# Patient Record
Sex: Female | Born: 1945 | Race: White | Hispanic: No | Marital: Married | Smoking: Never smoker
Health system: Southern US, Community
[De-identification: ages and names within clinical notes are randomized; demographics above are authoritative.]

## PROBLEM LIST (undated history)

## (undated) DIAGNOSIS — I2510 Atherosclerotic heart disease of native coronary artery without angina pectoris: Secondary | ICD-10-CM

## (undated) DIAGNOSIS — C801 Malignant (primary) neoplasm, unspecified: Secondary | ICD-10-CM

## (undated) DIAGNOSIS — I10 Essential (primary) hypertension: Secondary | ICD-10-CM

## (undated) DIAGNOSIS — E785 Hyperlipidemia, unspecified: Secondary | ICD-10-CM

## (undated) HISTORY — DX: Hyperlipidemia, unspecified: E78.5

## (undated) HISTORY — DX: Atherosclerotic heart disease of native coronary artery without angina pectoris: I25.10

## (undated) HISTORY — DX: Malignant (primary) neoplasm, unspecified: C80.1

## (undated) HISTORY — PX: BREAST BIOPSY: SHX20

## (undated) HISTORY — DX: Essential (primary) hypertension: I10

---

## 1999-08-15 ENCOUNTER — Other Ambulatory Visit: Admission: RE | Admit: 1999-08-15 | Discharge: 1999-08-15 | Payer: Self-pay | Admitting: *Deleted

## 1999-08-15 ENCOUNTER — Encounter: Payer: Self-pay | Admitting: *Deleted

## 1999-08-15 ENCOUNTER — Encounter: Admission: RE | Admit: 1999-08-15 | Discharge: 1999-08-15 | Payer: Self-pay | Admitting: *Deleted

## 2002-12-22 ENCOUNTER — Encounter: Admission: RE | Admit: 2002-12-22 | Discharge: 2002-12-22 | Payer: Self-pay | Admitting: *Deleted

## 2004-02-10 ENCOUNTER — Encounter: Admission: RE | Admit: 2004-02-10 | Discharge: 2004-02-10 | Payer: Self-pay | Admitting: *Deleted

## 2005-05-02 ENCOUNTER — Encounter: Admission: RE | Admit: 2005-05-02 | Discharge: 2005-05-02 | Payer: Self-pay | Admitting: *Deleted

## 2006-05-23 ENCOUNTER — Encounter: Admission: RE | Admit: 2006-05-23 | Discharge: 2006-05-23 | Payer: Self-pay | Admitting: *Deleted

## 2007-05-27 ENCOUNTER — Encounter: Admission: RE | Admit: 2007-05-27 | Discharge: 2007-05-27 | Payer: Self-pay | Admitting: *Deleted

## 2008-02-23 ENCOUNTER — Encounter: Admission: RE | Admit: 2008-02-23 | Discharge: 2008-02-23 | Payer: Self-pay | Admitting: Otolaryngology

## 2008-03-01 ENCOUNTER — Other Ambulatory Visit: Admission: RE | Admit: 2008-03-01 | Discharge: 2008-03-01 | Payer: Self-pay | Admitting: Otolaryngology

## 2008-05-27 ENCOUNTER — Encounter: Admission: RE | Admit: 2008-05-27 | Discharge: 2008-05-27 | Payer: Self-pay | Admitting: *Deleted

## 2008-11-09 ENCOUNTER — Encounter: Admission: RE | Admit: 2008-11-09 | Discharge: 2008-11-09 | Payer: Self-pay | Admitting: Otolaryngology

## 2009-05-23 HISTORY — PX: CARDIOVASCULAR STRESS TEST: SHX262

## 2009-05-30 ENCOUNTER — Encounter: Admission: RE | Admit: 2009-05-30 | Discharge: 2009-05-30 | Payer: Self-pay | Admitting: *Deleted

## 2010-04-21 ENCOUNTER — Other Ambulatory Visit: Payer: Self-pay | Admitting: *Deleted

## 2010-04-21 DIAGNOSIS — Z1231 Encounter for screening mammogram for malignant neoplasm of breast: Secondary | ICD-10-CM

## 2010-06-01 ENCOUNTER — Ambulatory Visit
Admission: RE | Admit: 2010-06-01 | Discharge: 2010-06-01 | Disposition: A | Discharge status: Home / Self Care | Payer: Medicare Other | Source: Ambulatory Visit | Attending: *Deleted | Admitting: *Deleted

## 2010-06-01 DIAGNOSIS — Z1231 Encounter for screening mammogram for malignant neoplasm of breast: Secondary | ICD-10-CM

## 2010-09-27 IMAGING — US US SOFT TISSUE HEAD/NECK
1 series · 14 of 25 positions shown · non-contrast
Comparison: None

CLINICAL DATA: Right thyroid nodule.

THYROID ULTRASOUND
TECHNIQUE: Ultrasound examination of the thyroid gland and
adjacent soft tissues was performed.

[Series 1: us soft tissue head/neck · 0.07mm/px · 14 of 47 slices shown]
[im 1/47]
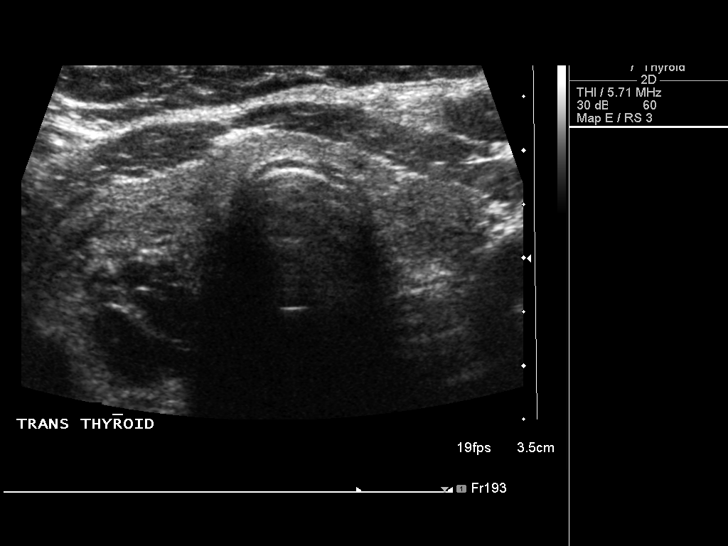
[im 4/47]
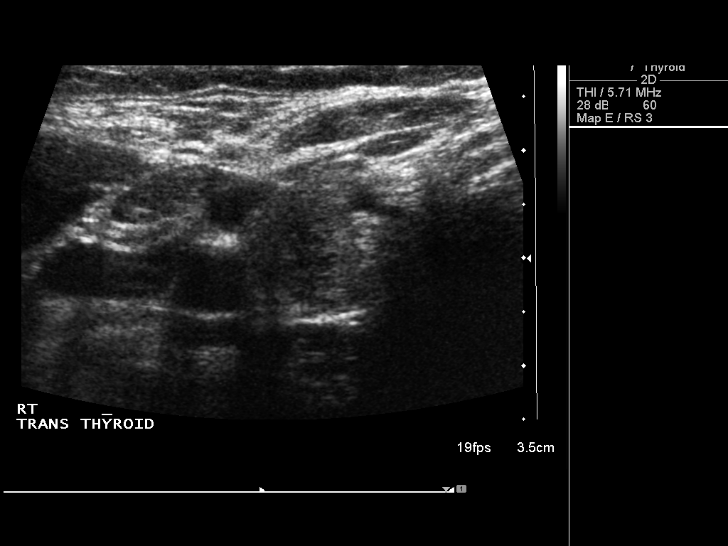
[im 8/47]
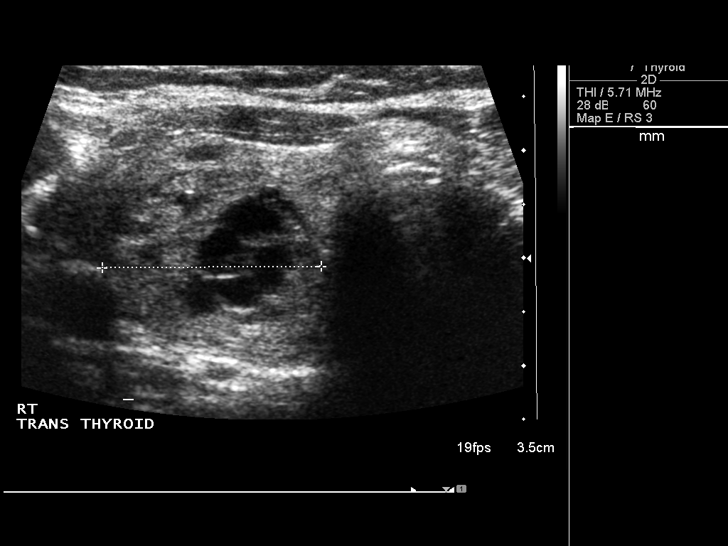
[im 12/47]
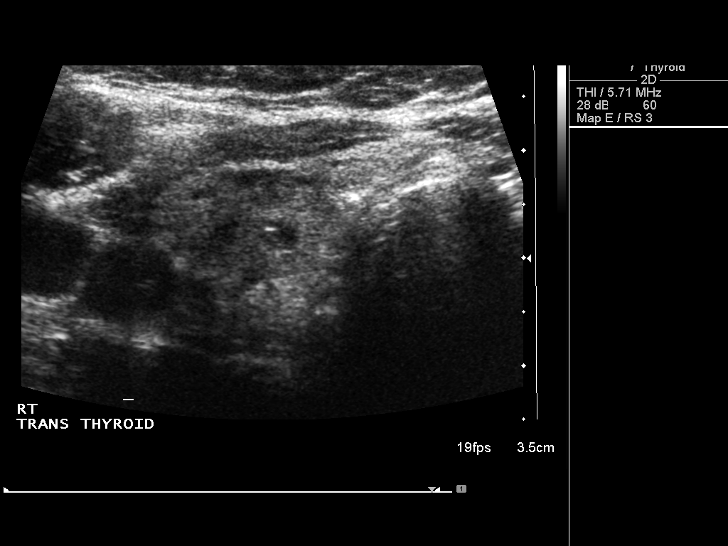
[im 16/47]
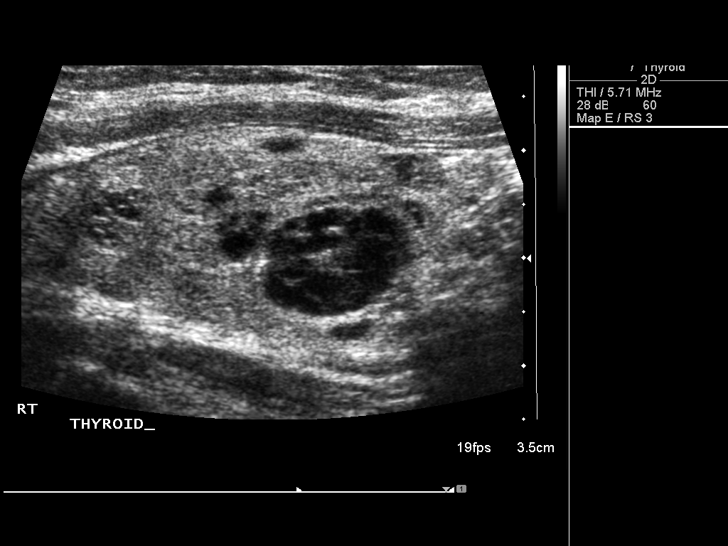
[im 18/47]
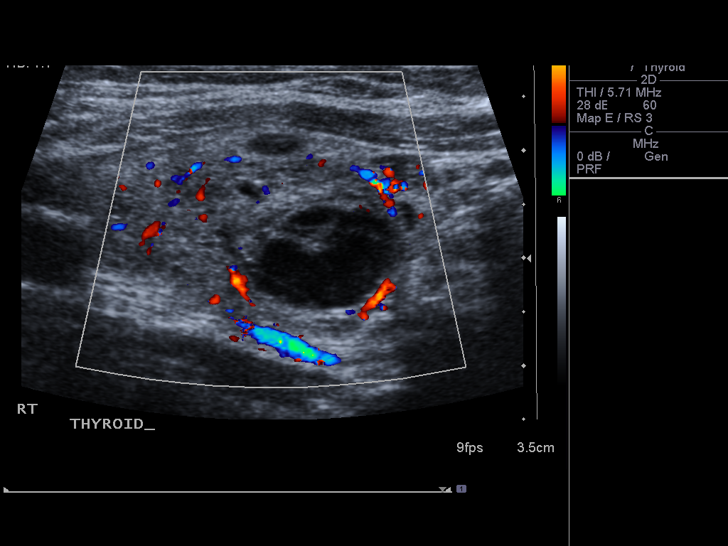
[im 22/47]
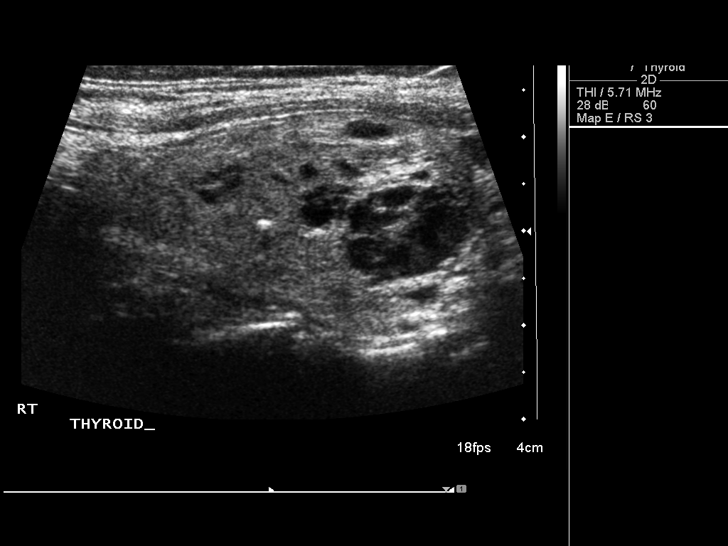
[im 25/47]
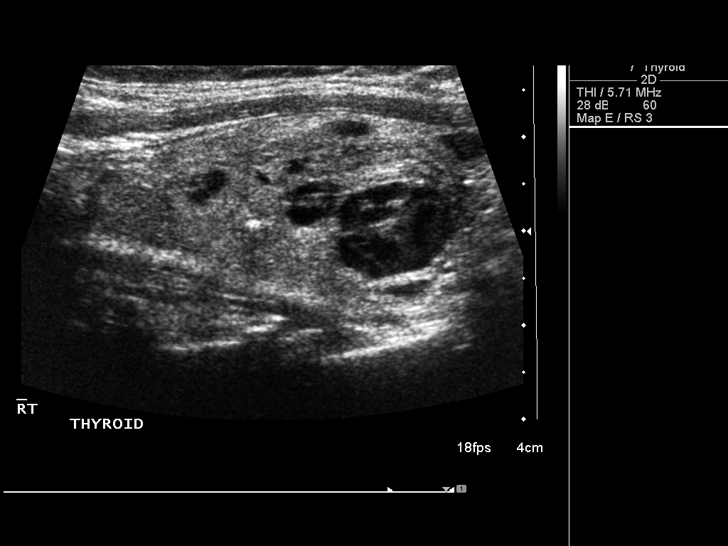
[im 29/47]
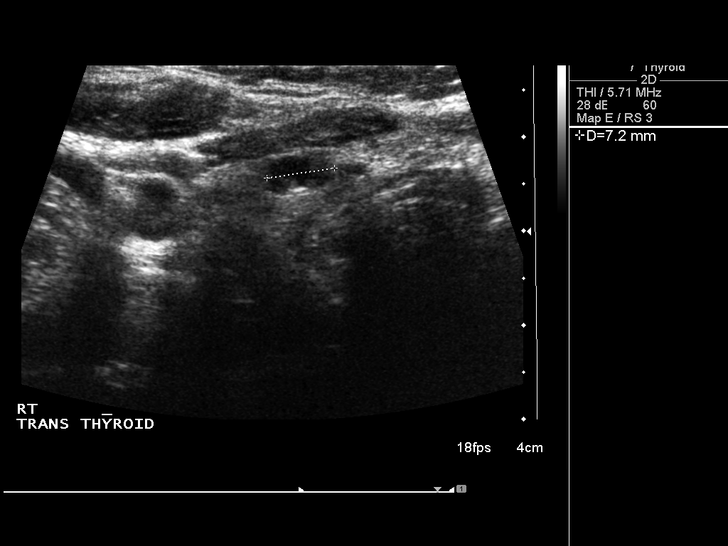
[im 31/47]
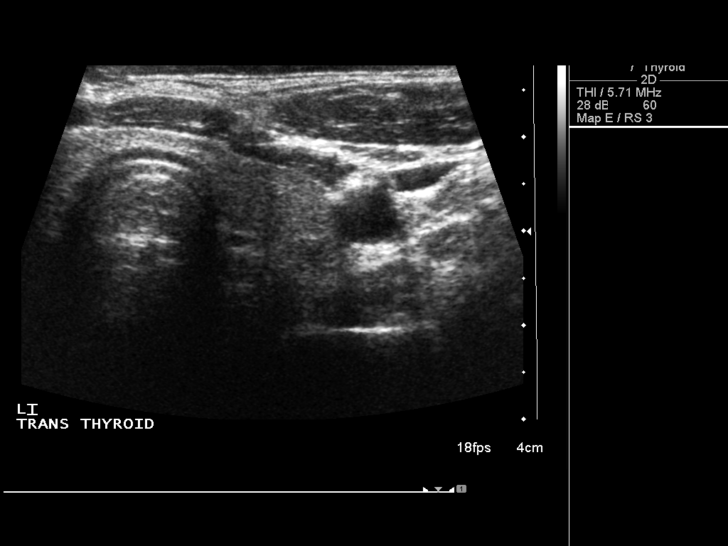
[im 35/47]
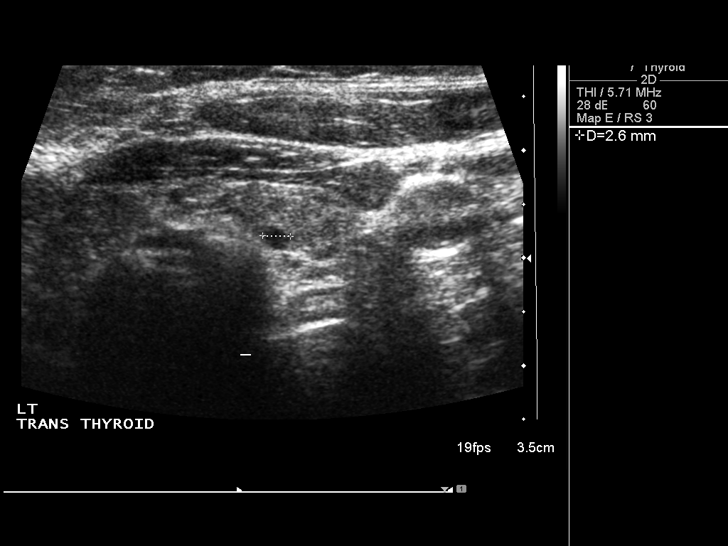
[im 39/47]
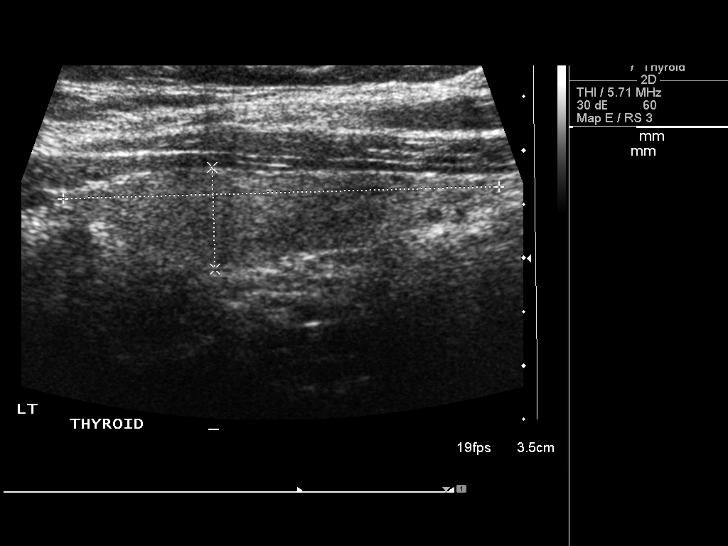
[im 43/47]
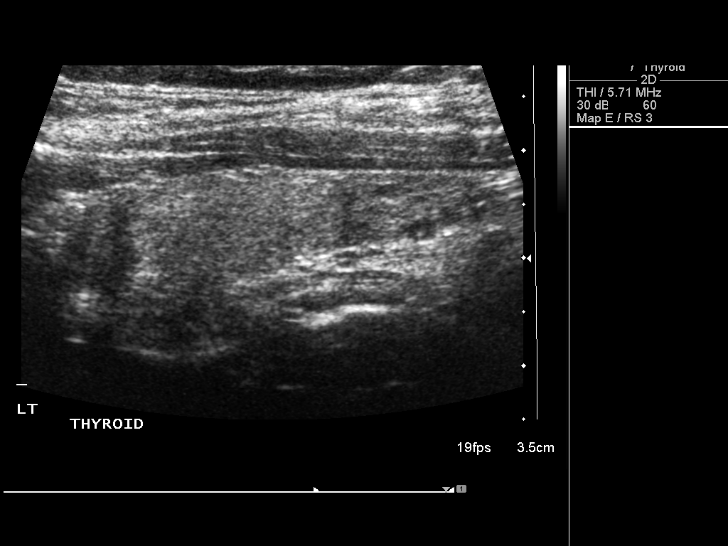
[im 47/47]
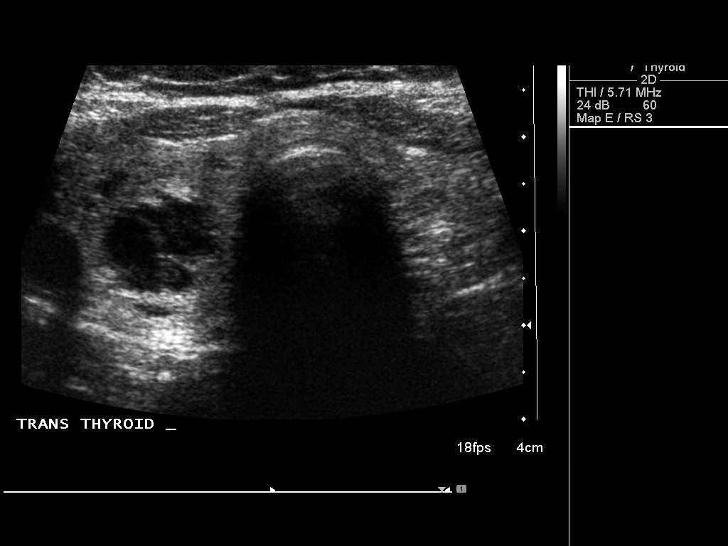

[14 of 25 positions shown; findings below may reference images not displayed]

FINDINGS: Right lobe of the thyroid measures 4.0 x 2.3 x 2.0 cm and
left lobe, 4.1 x 0.9 x 1.3 cm.  Isthmus measures 2 mm.  Thyroid
echotexture is heterogeneous.  There are sub centimeter hypoechoic
nodules bilaterally.  A dominant cystic and solid nodule spans the
mid and lower poles of the right thyroid, is somewhat ill-defined,
and measures approximately 2.5 x 1.6 x 2.0 cm.
IMPRESSION: Dominant cystic and solid right thyroid nodule.  Percutaneous
biopsy should be considered, as neoplasm cannot be excluded.

## 2011-04-24 ENCOUNTER — Other Ambulatory Visit: Payer: Self-pay | Admitting: *Deleted

## 2011-04-24 DIAGNOSIS — Z1231 Encounter for screening mammogram for malignant neoplasm of breast: Secondary | ICD-10-CM

## 2011-06-05 ENCOUNTER — Ambulatory Visit
Admission: RE | Admit: 2011-06-05 | Discharge: 2011-06-05 | Disposition: A | Discharge status: Home / Self Care | Payer: Medicare Other | Source: Ambulatory Visit | Attending: *Deleted | Admitting: *Deleted

## 2011-06-05 DIAGNOSIS — Z1231 Encounter for screening mammogram for malignant neoplasm of breast: Secondary | ICD-10-CM

## 2011-06-06 ENCOUNTER — Other Ambulatory Visit: Payer: Self-pay | Admitting: *Deleted

## 2011-06-06 DIAGNOSIS — R928 Other abnormal and inconclusive findings on diagnostic imaging of breast: Secondary | ICD-10-CM

## 2011-06-08 ENCOUNTER — Ambulatory Visit
Admission: RE | Admit: 2011-06-08 | Discharge: 2011-06-08 | Disposition: A | Discharge status: Home / Self Care | Payer: Medicare Other | Source: Ambulatory Visit | Attending: *Deleted | Admitting: *Deleted

## 2011-06-08 DIAGNOSIS — R928 Other abnormal and inconclusive findings on diagnostic imaging of breast: Secondary | ICD-10-CM

## 2011-06-14 IMAGING — US US SOFT TISSUE HEAD/NECK
1 series · 14 of 23 positions shown · non-contrast
Comparison: 02/23/2008

CLINICAL DATA: Right thyroid nodule

THYROID ULTRASOUND
TECHNIQUE: Ultrasound examination of the thyroid gland and
adjacent soft tissues was performed.

[Series 1: us soft tissue head/neck · 0.07mm/px · 14 of 23 slices shown]
[im 1/23]
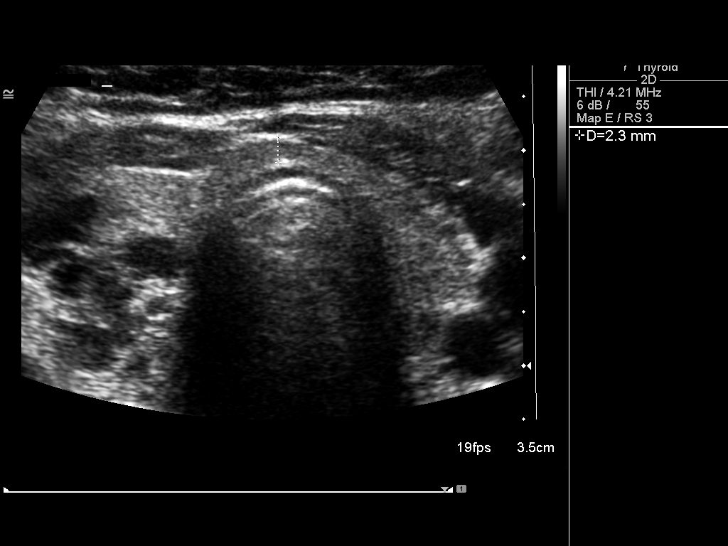
[im 3/23]
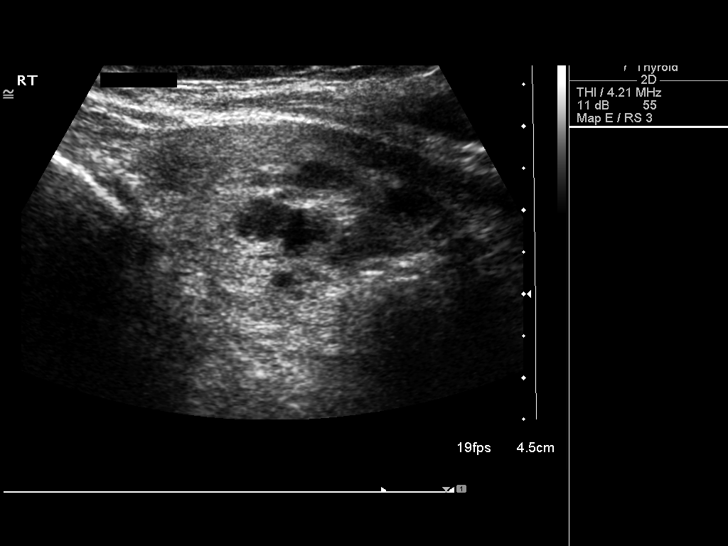
[im 5/23]
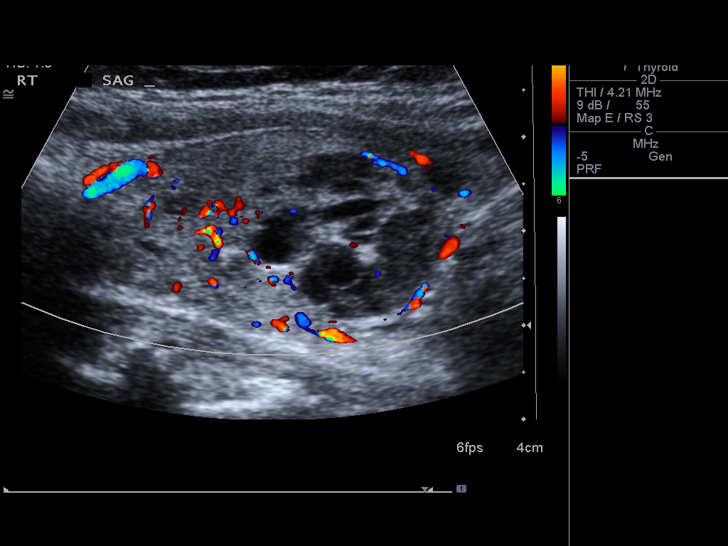
[im 6/23]
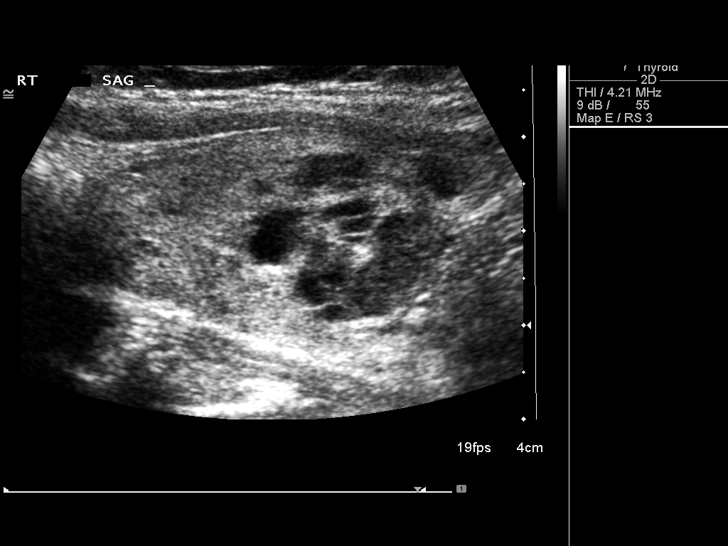
[im 8/23]
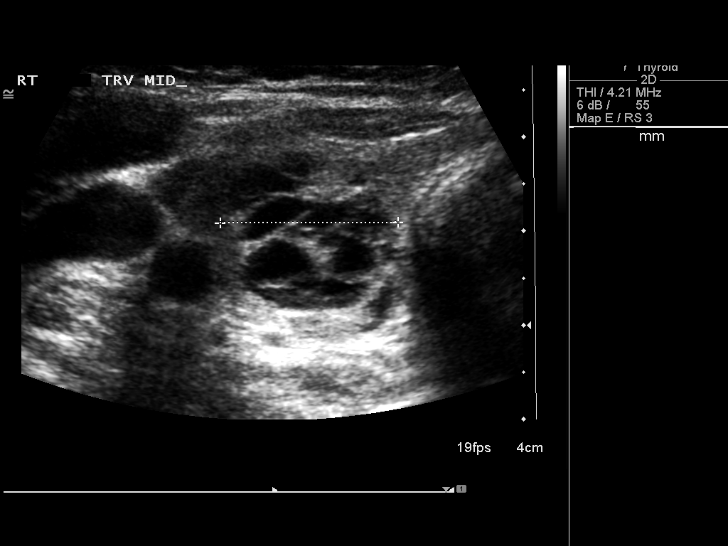
[im 10/23]
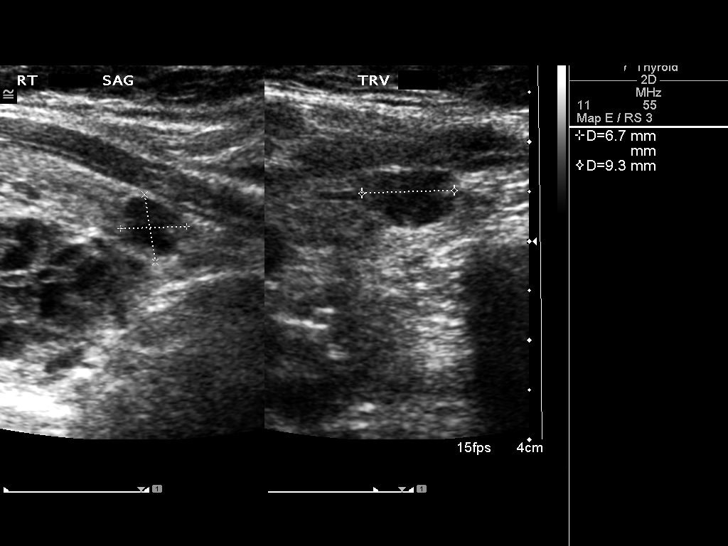
[im 11/23]
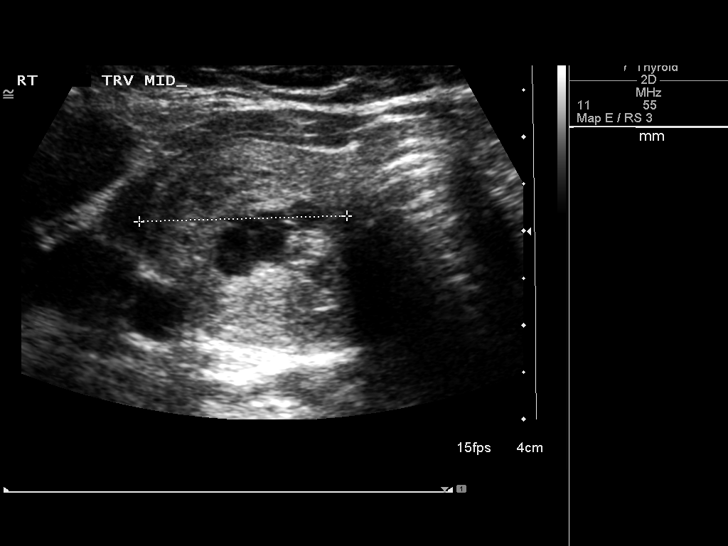
[im 13/23]
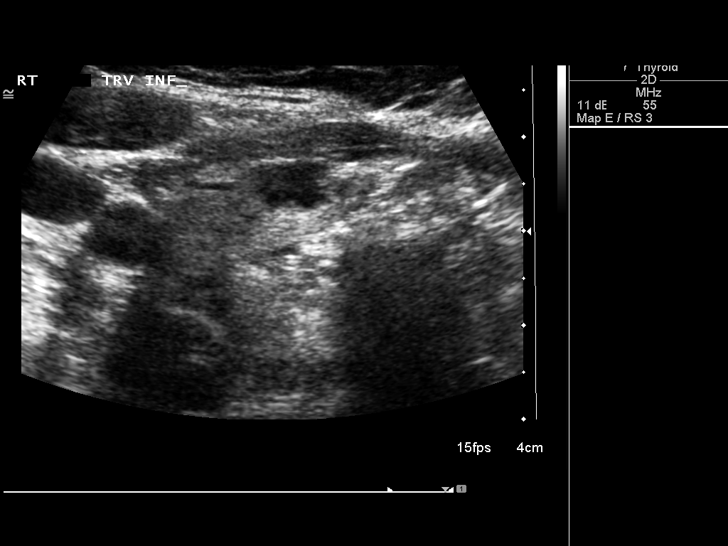
[im 14/23]
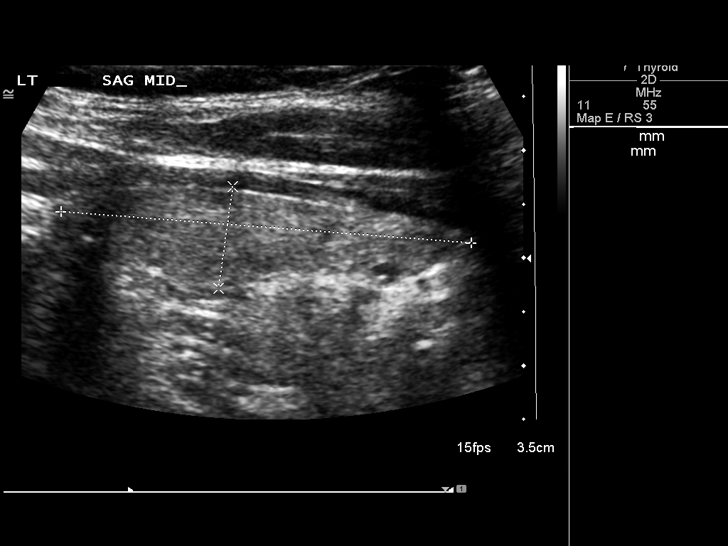
[im 16/23]
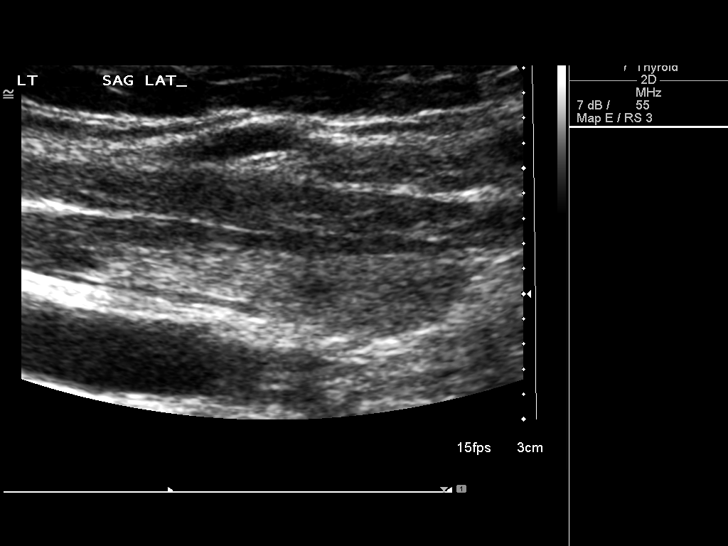
[im 18/23]
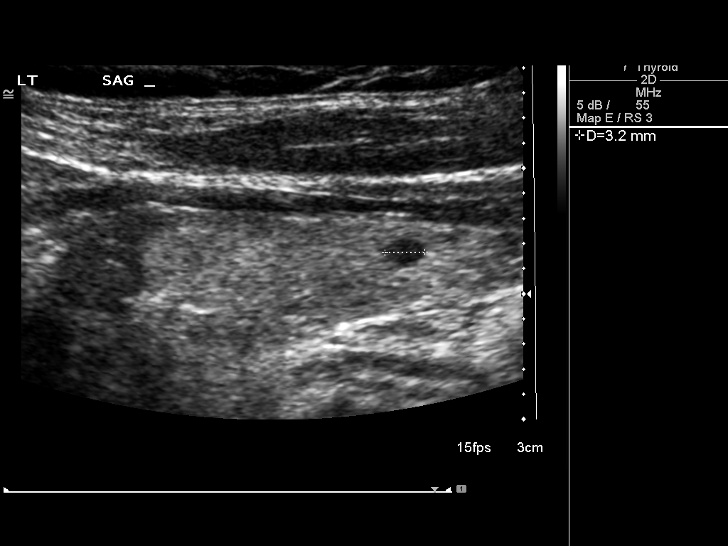
[im 19/23]
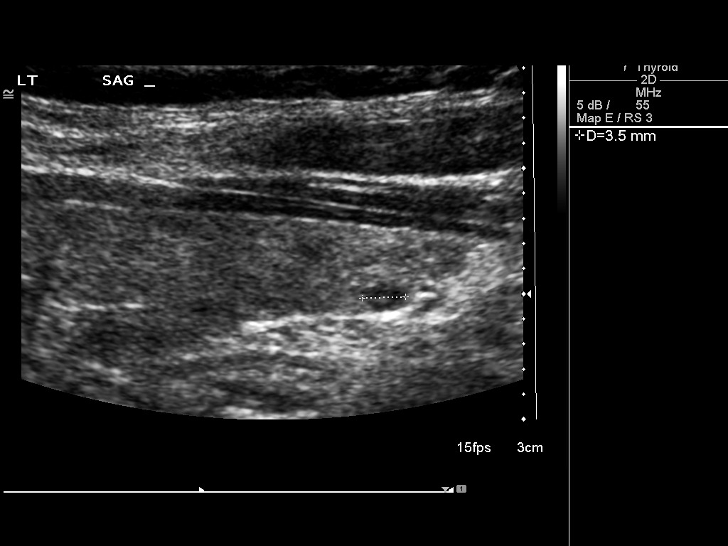
[im 21/23]
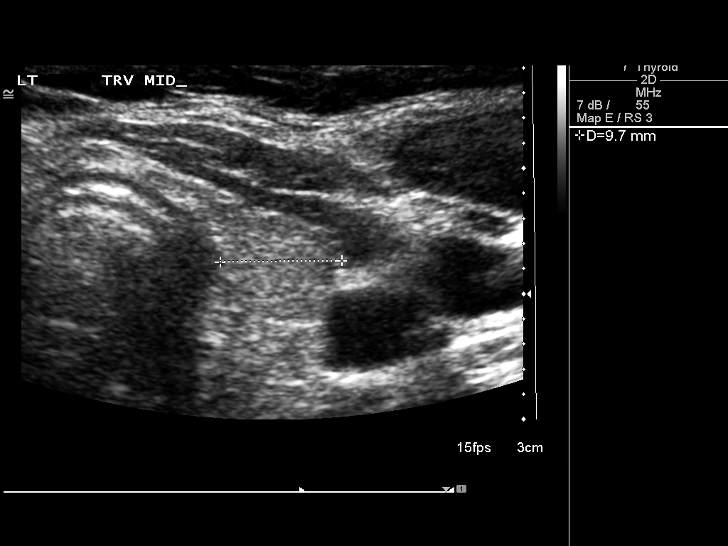
[im 23/23]
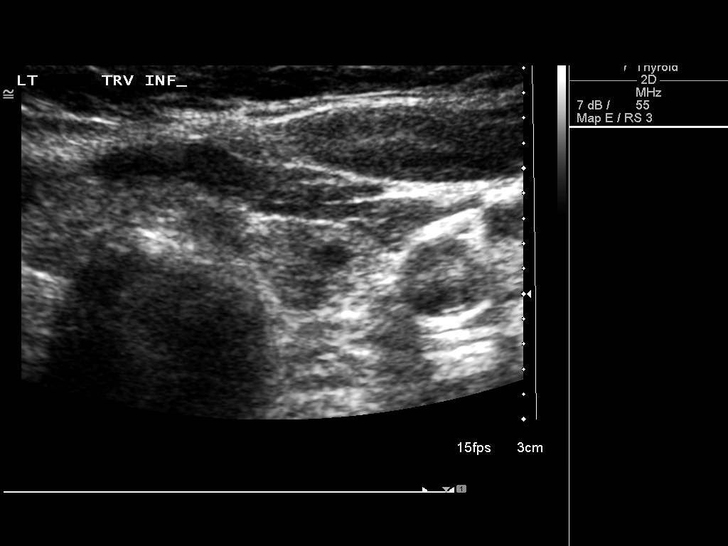

[14 of 23 positions shown; findings below may reference images not displayed]

FINDINGS: Right lobe:  4.9 x 2.7 x 2.2 cm

Left lobe:  3.8 x 1.0 x 1.0 cm

Thyroid isthmus:  0.2 cm

Nodule in the inferior aspect the right lobe again demonstrated
with stable dimensions.  Current dimensions are approximately 2.6 x
2.2 x 1.9 cm (previously 2.5 x 1.6 x 2.0 cm). By ultrasound, this
lesion is again noted to be predominately cystic and shows some
increased extension of cystic degeneration anteriorly.  No
calcifications are identified.

Other smaller hypoechoic nodules bilaterally are again seen and on
the right side, are less prominent overall than previously with
nodules of 0.6 cm and 0.9 cm demonstrated.
IMPRESSION: Stable dimensions of dominant right thyroid nodule. Other smaller
nodules in the right lobe are less prominent.

## 2011-07-11 HISTORY — PX: OTHER SURGICAL HISTORY: SHX169

## 2011-11-05 ENCOUNTER — Other Ambulatory Visit: Payer: Self-pay | Admitting: *Deleted

## 2011-11-05 DIAGNOSIS — N6009 Solitary cyst of unspecified breast: Secondary | ICD-10-CM

## 2011-12-10 ENCOUNTER — Ambulatory Visit
Admission: RE | Admit: 2011-12-10 | Discharge: 2011-12-10 | Disposition: A | Discharge status: Home / Self Care | Payer: Medicare Other | Source: Ambulatory Visit | Attending: *Deleted | Admitting: *Deleted

## 2011-12-10 DIAGNOSIS — N6009 Solitary cyst of unspecified breast: Secondary | ICD-10-CM

## 2012-03-04 ENCOUNTER — Other Ambulatory Visit (HOSPITAL_COMMUNITY): Payer: Medicare Other

## 2012-05-02 ENCOUNTER — Other Ambulatory Visit: Payer: Self-pay | Admitting: *Deleted

## 2012-05-27 ENCOUNTER — Inpatient Hospital Stay (HOSPITAL_COMMUNITY): Admission: RE | Admit: 2012-05-27 | Payer: Medicare Other | Source: Ambulatory Visit

## 2012-05-29 ENCOUNTER — Ambulatory Visit (HOSPITAL_COMMUNITY)
Admission: RE | Admit: 2012-05-29 | Discharge: 2012-05-29 | Disposition: A | Discharge status: Home / Self Care | Payer: Medicare Other | Source: Ambulatory Visit | Attending: Cardiovascular Disease | Admitting: Cardiovascular Disease

## 2012-05-29 DIAGNOSIS — I10 Essential (primary) hypertension: Secondary | ICD-10-CM | POA: Insufficient documentation

## 2012-05-29 DIAGNOSIS — Z6837 Body mass index (BMI) 37.0-37.9, adult: Secondary | ICD-10-CM | POA: Insufficient documentation

## 2012-05-29 DIAGNOSIS — R011 Cardiac murmur, unspecified: Secondary | ICD-10-CM | POA: Insufficient documentation

## 2012-05-29 HISTORY — PX: TRANSTHORACIC ECHOCARDIOGRAM: SHX275

## 2012-05-29 NOTE — Progress Notes (Signed)
*  PRELIMINARY RESULTS* Echocardiogram 2D Echocardiogram has been performed.  Megan Walls 05/29/2012, 10:04 AM

## 2012-06-12 ENCOUNTER — Ambulatory Visit
Admission: RE | Admit: 2012-06-12 | Discharge: 2012-06-12 | Disposition: A | Discharge status: Home / Self Care | Payer: Medicare Other | Source: Ambulatory Visit | Attending: *Deleted | Admitting: *Deleted

## 2012-06-12 DIAGNOSIS — N6001 Solitary cyst of right breast: Secondary | ICD-10-CM

## 2012-09-25 ENCOUNTER — Encounter: Payer: Self-pay | Admitting: Cardiovascular Disease

## 2013-03-02 ENCOUNTER — Ambulatory Visit: Payer: Medicare Other | Admitting: Cardiovascular Disease

## 2013-03-31 ENCOUNTER — Ambulatory Visit: Payer: Medicare Other | Admitting: Cardiovascular Disease

## 2013-04-28 ENCOUNTER — Encounter: Payer: Self-pay | Admitting: Cardiovascular Disease

## 2013-04-28 ENCOUNTER — Ambulatory Visit (INDEPENDENT_AMBULATORY_CARE_PROVIDER_SITE_OTHER): Payer: Medicare Other | Admitting: Cardiovascular Disease

## 2013-04-28 VITALS — BP 134/82 | HR 81 | Ht 64.0 in | Wt 226.4 lb

## 2013-04-28 DIAGNOSIS — R0989 Other specified symptoms and signs involving the circulatory and respiratory systems: Secondary | ICD-10-CM

## 2013-04-28 DIAGNOSIS — Z8542 Personal history of malignant neoplasm of other parts of uterus: Secondary | ICD-10-CM

## 2013-04-28 DIAGNOSIS — Z862 Personal history of diseases of the blood and blood-forming organs and certain disorders involving the immune mechanism: Secondary | ICD-10-CM

## 2013-04-28 DIAGNOSIS — R0683 Snoring: Secondary | ICD-10-CM

## 2013-04-28 DIAGNOSIS — G479 Sleep disorder, unspecified: Secondary | ICD-10-CM

## 2013-04-28 DIAGNOSIS — E669 Obesity, unspecified: Secondary | ICD-10-CM

## 2013-04-28 DIAGNOSIS — Z8639 Personal history of other endocrine, nutritional and metabolic disease: Secondary | ICD-10-CM

## 2013-04-28 DIAGNOSIS — R0609 Other forms of dyspnea: Secondary | ICD-10-CM

## 2013-04-28 DIAGNOSIS — I10 Essential (primary) hypertension: Secondary | ICD-10-CM

## 2013-04-28 DIAGNOSIS — E785 Hyperlipidemia, unspecified: Secondary | ICD-10-CM

## 2013-04-28 NOTE — Patient Instructions (Signed)
Your physician recommends that you schedule a follow-up appointment in: 3 months  Your physician has recommended you make the following change in your medication: Stop Diovan  Your physician has recommended that you have a sleep study. This test records several body functions during sleep, including: brain activity, eye movement, oxygen and carbon dioxide blood levels, heart rate and rhythm, breathing rate and rhythm, the flow of air through your mouth and nose, snoring, body muscle movements, and chest and belly movement.

## 2013-05-05 ENCOUNTER — Other Ambulatory Visit: Payer: Self-pay

## 2013-05-05 DIAGNOSIS — Z1231 Encounter for screening mammogram for malignant neoplasm of breast: Secondary | ICD-10-CM

## 2013-05-17 ENCOUNTER — Encounter: Payer: Self-pay | Admitting: Cardiovascular Disease

## 2013-05-17 DIAGNOSIS — G479 Sleep disorder, unspecified: Secondary | ICD-10-CM | POA: Insufficient documentation

## 2013-05-17 DIAGNOSIS — Z8542 Personal history of malignant neoplasm of other parts of uterus: Secondary | ICD-10-CM | POA: Insufficient documentation

## 2013-05-17 DIAGNOSIS — E669 Obesity, unspecified: Secondary | ICD-10-CM | POA: Insufficient documentation

## 2013-05-17 DIAGNOSIS — E785 Hyperlipidemia, unspecified: Secondary | ICD-10-CM | POA: Insufficient documentation

## 2013-05-17 DIAGNOSIS — I10 Essential (primary) hypertension: Secondary | ICD-10-CM | POA: Insufficient documentation

## 2013-05-17 DIAGNOSIS — Z8639 Personal history of other endocrine, nutritional and metabolic disease: Secondary | ICD-10-CM | POA: Insufficient documentation

## 2013-05-17 NOTE — Progress Notes (Signed)
Patient ID: Megan Walls, female   DOB: Feb 21, 1945, 68 y.o.   MRN: [id number]     PATIENT PROFILE: Megan Walls is a 68year-old female who is a former patient of Megan Walls drop. She is followed by Megan Walls for primary care. 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   HPI: This Megan Walls has a history of hypertension, hyperlipidemia, as well as obesity. Review of her chart indicates that she has been on both direct renin in addition as well as very low dose ARB therapy for her blood pressure. She also has been on simvastatin 20 mg for hyperlipidemia. She last saw Megan Walls in July 2014.  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 There is no established coronary artery disease. She also has a history of thyroid nodules which were felt to be benign. She states that there is also a history of endometrial cancer and 37 lymph nodes were removed. There also is a history of glaucoma.   She does admit to some difficulty with sleeping and has noticed frequent episodes of awakening. She also notes fatigue and at times residual daytime sleepiness.  She did have an echo Doppler study done in April 2014 which showed mild concentric left ventricular hypertrophy with normal systolic function. There was grade 1 diastolic dysfunction. There is evidence for mitral annular calcification with mild mitral regurgitation. There was mild pulmonary hypertension with PA pressure 37 mm.  Past Medical History  Diagnosis Date  . Hypertension   . Hyperlipidemia   . Cancer     endometrial    History reviewed. No pertinent past surgical history.  Not on File  Current Outpatient Prescriptions  Medication Sig Dispense Refill  . Multiple Vitamins-Minerals (CENTRUM SILVER ULTRA WOMENS PO) Take 1 tablet by mouth daily.      . timolol (BETIMOL) 0.5 % ophthalmic solution Place 1  drop into both eyes daily.      . Vitamin D, Ergocalciferol, (DRISDOL) 50000 UNITS CAPS capsule Take 50,000 Units by mouth every 7 (seven) days.      . simvastatin (ZOCOR) 20 MG tablet Take 1 tablet by mouth daily.      . TEKTURNA HCT 300-25 MG TABS Take 1 tablet by mouth daily.       No current facility-administered medications for this visit.   Socially she is married and has 2 children, 6 grandchildren and 7 step grandchildren. There is no history of tobacco use. She does not do routine exercise but she does do yard work and takes of her flower garden.  Family History  Problem Relation Age of Onset  . Cancer - Other Mother     metastatic breast  . Diverticulitis Mother   . Heart disease Mother     valve replacement  . Heart attack Father   . Heart disease Father   . Hyperlipidemia Father   . Hypertension Father   . Heart failure Sister     valve replacement  . Cancer - Other Brother     kidney  . Heart disease Paternal Uncle   . Heart failure Sister     heart valve replacement  . Cancer - Prostate Brother     ROS is negative for fever chills or night sweats. She denies change in weight. She denies change in vision or hearing. She is unaware of lymphadenopathy. There is no wheezing. She denies cough. There  is no presyncope or syncope. She is unaware of palpitations. She denies exertional chest pain. There is no shortness of breath. She denies nausea vomiting or diarrhea. He denies blood in stool or urine. She denies claudication. There is no significant edema. There is no diabetes. She is unaware of cold or heat intolerance. She does have difficulty with sleep. Other comprehensive 14 point system review is negative.  PE BP 134/82  Pulse 81  Ht 5\' 4"  (1.626 m)  Wt 226 lb 6.4 oz (102.694 kg)  BMI 38.84 kg/m2 General: Alert, oriented, no distress.  Skin: normal turgor, no rashes HEENT: Normocephalic, atraumatic. Pupils round and reactive; sclera anicteric; extraocular muscles  intact; Fundi without hemorrhages or exudates. No xanthelasma as per Megan Walls without nasal septal hypertrophy Mouth/Parynx benign; Mallinpatti scale 2 Neck: No JVD, no carotid bruits; normal carotid upstroke Lungs: clear to ausculatation and percussion; no wheezing or rales Chest wall: without tenderness to palpitation Heart: RRR, s1 s2 normal, 1/6 sem; No S3 or S4 gallop. No rubs thrills or heaves. Abdomen: soft, nontender; no hepatosplenomehaly, BS+; abdominal aorta nontender and not dilated by palpation. Back: no CVA tenderness Pulses 2+ Extremities: Very mild ankle edema bilaterally, no clubbinbg cyanosis, Homan's sign negative  Neurologic: grossly nonfocal; Cranial nerves grossly wnl Psychologic: Normal mood and affect    ECG (independently read by me): Normal sinus rhythm at 81 beats per minute. Normal intervals.  LABS:  BMET No results found for this basename: na, k, cl, co2, glucose, bun, creatinine, calcium, gfrnonaa, gfraa     Hepatic Function Panel  No results found for this basename: prot, albumin, ast, alt, alkphos, bilitot, bilidir, ibili     CBC No results found for this basename: wbc, rbc, hgb, hct, plt, mcv, mch, mchc, rdw, neutrabs, lymphsabs, monoabs, eosabs, basosabs     BNP No results found for this basename: probnp    Lipid Panel  No results found for this basename: chol, trig, hdl, cholhdl, vldl, ldlcalc     RADIOLOGY: No results found.   ASSESSMENT AND PLAN: Megan Walls is a 67 year old female who has a history of hypertension, hyperlipidemia, as well as mild obesity. Review of her chart indicates that she has been on Tekturna HCT 300/25 as well as Diovan 40 mg. Her blood pressure today is stable. I am recommending she discontinue her Diovan 40 mg. We will monitor her blood pressure off this to make certain there is no significant additional increase. Due to difficulty with sleep, I am recommending she undergo a diagnostic polysomnogram for  further evaluation. She does have moderate obesity with a body mass index of 38.8 kg per meter squared. She does have mild pulmonary hypertension which has been associated with obstructive sleep apnea. I will see her back in the office in 3 months for cardiology reevaluation.   Troy Sine, MD, [facility] 05/17/2013 5:00 PM

## 2013-06-15 ENCOUNTER — Ambulatory Visit
Admission: RE | Admit: 2013-06-15 | Discharge: 2013-06-15 | Disposition: A | Discharge status: Home / Self Care | Payer: Self-pay | Source: Ambulatory Visit

## 2013-06-15 ENCOUNTER — Encounter (INDEPENDENT_AMBULATORY_CARE_PROVIDER_SITE_OTHER): Payer: Self-pay

## 2013-06-15 DIAGNOSIS — Z1231 Encounter for screening mammogram for malignant neoplasm of breast: Secondary | ICD-10-CM

## 2013-08-03 ENCOUNTER — Ambulatory Visit: Payer: Medicare Other | Admitting: Cardiovascular Disease

## 2013-08-06 ENCOUNTER — Ambulatory Visit: Payer: Medicare Other | Admitting: Cardiovascular Disease

## 2013-11-11 ENCOUNTER — Encounter: Payer: Self-pay | Admitting: Cardiovascular Disease

## 2013-11-11 ENCOUNTER — Ambulatory Visit (INDEPENDENT_AMBULATORY_CARE_PROVIDER_SITE_OTHER): Payer: Medicare Other | Admitting: Cardiovascular Disease

## 2013-11-11 VITALS — BP 128/80 | HR 70 | Ht 64.0 in | Wt 223.6 lb

## 2013-11-11 DIAGNOSIS — R0683 Snoring: Secondary | ICD-10-CM

## 2013-11-11 DIAGNOSIS — I10 Essential (primary) hypertension: Secondary | ICD-10-CM

## 2013-11-11 DIAGNOSIS — E669 Obesity, unspecified: Secondary | ICD-10-CM

## 2013-11-11 DIAGNOSIS — R0609 Other forms of dyspnea: Secondary | ICD-10-CM

## 2013-11-11 DIAGNOSIS — G4733 Obstructive sleep apnea (adult) (pediatric): Secondary | ICD-10-CM

## 2013-11-11 DIAGNOSIS — E785 Hyperlipidemia, unspecified: Secondary | ICD-10-CM

## 2013-11-11 DIAGNOSIS — Z8542 Personal history of malignant neoplasm of other parts of uterus: Secondary | ICD-10-CM

## 2013-11-11 DIAGNOSIS — R0989 Other specified symptoms and signs involving the circulatory and respiratory systems: Secondary | ICD-10-CM

## 2013-11-11 NOTE — Patient Instructions (Signed)
Your physician has recommended you make the following change in your medication: STOP THE VALSARTAN ( DIOVAN).  Your physician wants you to follow-up in: 1 YEAR. You will receive a reminder letter in the mail two months in advance. If you don't receive a letter, please call our office to schedule the follow-up appointment. We will contact you to schedule your 2 nd night sleep study once previous study reviewed.

## 2013-11-11 NOTE — Progress Notes (Signed)
Patient ID: KEIRA BOHLIN, female   DOB: August 28, 1945, 68 y.o.   MRN: [id number]     HPI:  Ms. Waldine Zenz is a 68year-old female who is a former patient of Dr. Terance Ice and is followed by Dr. Octavio Graves for primary care.  She establish cardiology care with me in March 2015.  She presents for 6 month evaluation.  Ms. Benning has a history of hypertension, hyperlipidemia, as well as obesity. Review of her chart indicates that she has been on both direct renin in addition as well as very low dose ARB therapy for her blood pressure. She also has been on simvastatin 20 mg for hyperlipidemia. She last saw Dr. Rollene Fare in July 2014.  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 There is no established coronary artery disease. She also has a history of thyroid nodules which were felt to be benign. She states that there is also a history of endometrial cancer and 37 lymph nodes were removed. There also is a history of glaucoma.   An echo Doppler study done in April 2014 which showed mild concentric left ventricular hypertrophy with normal systolic function. There was grade 1 diastolic dysfunction. There is evidence for mitral annular calcification with mild mitral regurgitation. There was mild pulmonary hypertension with PA pressure 37 mm.  She does admit to some difficulty with sleeping and has noticed frequent episodes of awakening. She also notes fatigue and at times residual daytime sleepiness.  When I initially saw her, I recommended that she discontinue her Diovan and continue with her Tekturna HCT 300/25.  Apparently, she had done this, but her primary physician recommended that she begin just take low-dose Diovan 40 mg every other day.  In addition to her Tekturna HCT.  Her blood pressures have remained stable.  She denies any further blood pressure elevation.   Due to concerns for sleep apnea, and also recommended that she undergo a diagnostic sleep study.  She  underwent a diagnostic polysomnogram, which confirmed mild sleep apnea overall with an AHI of 12.0; however, during sleep for sleep apnea was very severe, with an AHI of 56.  She dropped her oxygen saturation to 86% and had evidence for very loud snoring.  25 periodic lid movements were observed with an index of 7.0 with 8.9 per hour to arousal.  She presents for evaluation.  Past Medical History  Diagnosis Date  . Hypertension   . Hyperlipidemia   . Cancer     endometrial  . Coronary artery disease     Past Surgical History  Procedure Laterality Date  . Renal doppler  07/11/2011    Right renal artery-1-59% diameter reduction, left-normal, bilateral kidneys-normal in shape, symmetrical in shape, and no abnormal echogenicity or hydronephrosis.  . Cardiovascular stress test  05/23/2009    No scintigraphic evidence of inducible myocardial ischemia. No significant wall motion abnormalities noted. EKG negative for ischemia. Rare PVCs.   . Transthoracic echocardiogram  05/29/2012    EF 55-60%, mild LVH, no regional wall mtion abnormalities.    Allergies  Allergen Reactions  . Penicillins     Current Outpatient Prescriptions  Medication Sig Dispense Refill  . Multiple Vitamins-Minerals (CENTRUM SILVER ULTRA WOMENS PO) Take 1 tablet by mouth daily.      . simvastatin (ZOCOR) 20 MG tablet Take 1 tablet by mouth daily.      . TEKTURNA HCT 300-25 MG TABS Take 1 tablet by mouth daily.      . timolol (  BETIMOL) 0.5 % ophthalmic solution Place 1 drop into both eyes daily.      . Vitamin D, Ergocalciferol, (DRISDOL) 50000 UNITS CAPS capsule Take 50,000 Units by mouth every 7 (seven) days.       No current facility-administered medications for this visit.   Socially she is married and has 2 children, 6 grandchildren and 7 step grandchildren. There is no history of tobacco use. She does not do routine exercise but she does do yard work and takes of her flower garden.  Family History  Problem  Relation Age of Onset  . Cancer - Other Mother     metastatic breast  . Diverticulitis Mother   . Heart disease Mother     valve replacement  . Heart attack Father   . Heart disease Father   . Hyperlipidemia Father   . Hypertension Father   . Heart failure Sister     valve replacement  . Cancer - Other Brother     kidney  . Heart disease Paternal Uncle   . Heart failure Sister     heart valve replacement  . Cancer - Prostate Brother    ROS General: Negative; No fevers, chills, or night sweats;  HEENT: Negative; No changes in vision or hearing, sinus congestion, difficulty swallowing Pulmonary: Negative; No cough, wheezing, shortness of breath, hemoptysis Cardiovascular: Negative; No chest pain, presyncope, syncope, palpitations GI: Negative; No nausea, vomiting, diarrhea, or abdominal pain GU: Negative; No dysuria, hematuria, or difficulty voiding Musculoskeletal: Negative; no myalgias, joint pain, or weakness Hematologic/Oncology: Negative; no easy bruising, bleeding Endocrine: Negative; no heat/cold intolerance; no diabetes Neuro: Negative; no changes in balance, headaches Skin: Negative; No rashes or skin lesions Psychiatric: Negative; No behavioral problems, depression Sleep: Positive for obstructive sleep apnea, she's not get her CPAP titration trial.; No snoring, daytime sleepiness, hypersomnolence, bruxism, restless legs, hypnogognic hallucinations, no cataplexy Other comprehensive 14 point system review is negative.   PE BP 128/80  Pulse 70  Ht 5\' 4"  (1.626 m)  Wt 223 lb 9.6 oz (101.424 kg)  BMI 38.36 kg/m2 General: Alert, oriented, no distress.  Skin: normal turgor, no rashes HEENT: Normocephalic, atraumatic. Pupils round and reactive; sclera anicteric; extraocular muscles intact; Fundi without hemorrhages or exudates. No xanthelasma as per Nose without nasal septal hypertrophy Mouth/Parynx benign; Mallinpatti scale 2 Neck: No JVD, no carotid bruits; normal  carotid upstroke Lungs: clear to ausculatation and percussion; no wheezing or rales Chest wall: without tenderness to palpitation Heart: RRR, s1 s2 normal, 1/6 sem; No S3 or S4 gallop. No rubs thrills or heaves. Abdomen: soft, nontender; no hepatosplenomehaly, BS+; abdominal aorta nontender and not dilated by palpation. Back: no CVA tenderness Pulses 2+ Extremities: Very mild ankle edema bilaterally, no clubbinbg cyanosis, Homan's sign negative  Neurologic: grossly nonfocal; Cranial nerves grossly wnl Psychologic: Normal mood and affect   ECG (independently read by me): Normal sinus rhythm at 70 beats with mild sinus arrhythmia.  No significant ST changes.  Normal intervals.  Prior ECG : Normal sinus rhythm at 81 beats per minute. Normal intervals.  LABS:  BMET No results found for this basename: na,  k,  cl,  co2,  glucose,  bun,  creatinine,  calcium,  gfrnonaa,  gfraa     Hepatic Function Panel  No results found for this basename: prot,  albumin,  ast,  alt,  alkphos,  bilitot,  bilidir,  ibili     CBC No results found for this basename: wbc,  rbc,  hgb,  hct,  plt,  mcv,  mch,  mchc,  rdw,  neutrabs,  lymphsabs,  monoabs,  eosabs,  basosabs     BNP No results found for this basename: probnp    Lipid Panel  No results found for this basename: chol,  trig,  hdl,  cholhdl,  vldl,  ldlcalc     RADIOLOGY: No results found.   ASSESSMENT AND PLAN: Ms. Amber Williard is a 68 year old female with a history of hypertension, hyperlipidemia, as well as mild obesity.  When I last saw her, I recommended that she discontinue her Diovan since she was maintained on only a very minimal dose and had been on Tekturna HCT 300/25, for long duration, and was tolerating this well.  She now presents on even a lower dose and has just been taking valsartan 40 mg every other day.  Her blood pressure is excellent at 116/80.  At this point, I have recommended that she completely discontinue the  valsartan.  I did obtain the results of her sleep study.  This confirms mild sleep apnea overall, but severe sleep apnea with REM sleep with an AHI of 56.0 with evidence for loud snoring, and oxygen desaturation.  I will refer her to undergo a CPAP titration trial.  I will then need to see her back following CPAP initiation at a sleep clinic.  She does have remote history of endometrial cancer.  She is on simvastatin 20 mg for hyperlipidemia and is tolerating this well.  I'll see her in the office in 3-4 months for reevaluation or sooner if problems arise.  Troy Sine, MD, [facility] 11/11/2013 5:47 PM

## 2014-05-18 ENCOUNTER — Other Ambulatory Visit: Payer: Self-pay

## 2014-05-18 DIAGNOSIS — Z1231 Encounter for screening mammogram for malignant neoplasm of breast: Secondary | ICD-10-CM

## 2014-06-17 ENCOUNTER — Ambulatory Visit
Admission: RE | Admit: 2014-06-17 | Discharge: 2014-06-17 | Disposition: A | Discharge status: Home / Self Care | Payer: Medicare Other | Source: Ambulatory Visit

## 2014-06-17 DIAGNOSIS — Z1231 Encounter for screening mammogram for malignant neoplasm of breast: Secondary | ICD-10-CM

## 2014-11-23 ENCOUNTER — Ambulatory Visit (INDEPENDENT_AMBULATORY_CARE_PROVIDER_SITE_OTHER): Payer: Medicare Other | Admitting: Cardiovascular Disease

## 2014-11-23 ENCOUNTER — Encounter: Payer: Self-pay | Admitting: Cardiovascular Disease

## 2014-11-23 VITALS — BP 128/76 | HR 70 | Ht 63.0 in | Wt 225.7 lb

## 2014-11-23 DIAGNOSIS — I10 Essential (primary) hypertension: Secondary | ICD-10-CM

## 2014-11-23 DIAGNOSIS — Z8639 Personal history of other endocrine, nutritional and metabolic disease: Secondary | ICD-10-CM

## 2014-11-23 DIAGNOSIS — G4733 Obstructive sleep apnea (adult) (pediatric): Secondary | ICD-10-CM | POA: Diagnosis not present

## 2014-11-23 DIAGNOSIS — R011 Cardiac murmur, unspecified: Secondary | ICD-10-CM | POA: Diagnosis not present

## 2014-11-23 DIAGNOSIS — E669 Obesity, unspecified: Secondary | ICD-10-CM | POA: Diagnosis not present

## 2014-11-23 DIAGNOSIS — E785 Hyperlipidemia, unspecified: Secondary | ICD-10-CM

## 2014-11-23 NOTE — Patient Instructions (Signed)
Your physician has requested that you have an echocardiogram. Echocardiography is a painless test that uses sound waves to create images of your heart. It provides your doctor with information about the size and shape of your heart and how well your heart's chambers and valves are working. This procedure takes approximately one hour. There are no restrictions for this procedure. This will be done in February 2017.  Your physician has recommended that you have a split night  sleep study. This test records several body functions during sleep, including: brain activity, eye movement, oxygen and carbon dioxide blood levels, heart rate and rhythm, breathing rate and rhythm, the flow of air through your mouth and nose, snoring, body muscle movements, and chest and belly movement.  Your physician wants you to follow-up in: 5 months. You will receive a reminder letter in the mail two months in advance. If you don't receive a letter, please call our office to schedule the follow-up appointment.

## 2014-11-24 ENCOUNTER — Encounter: Payer: Self-pay | Admitting: Cardiovascular Disease

## 2014-11-24 NOTE — Progress Notes (Signed)
Patient ID: MAGGY WYBLE, female   DOB: 02-04-1946, 69 y.o.   MRN: [id number]     HPI:  Ms. Zeniyah Peaster is a 69year-old female who is a former patient of Dr. Terance Ice and is followed by Dr. Octavio Graves for primary care.  She presents for a one-year cardiology and sleep evaluation.  Ms. Inoue has a history of hypertension, hyperlipidemia, as well as obesity.  When I initially saw her chart review indicated that she has been on both direct renin in addition as well as very low dose ARB therapy for her blood pressure. She also has been on simvastatin 20 mg for hyperlipidemia. She last saw Dr. Rollene Fare in July 2014.  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 There is no established coronary artery disease. She also has a history of thyroid nodules which were felt to be benign. She states that there is also a history of endometrial cancer and 37 lymph nodes were removed. There also is a history of glaucoma.   An echo Doppler study in April 2014 showed mild concentric left ventricular hypertrophy with normal systolic function. There was grade 1 diastolic dysfunction. There is evidence for mitral annular calcification with mild mitral regurgitation. There was mild pulmonary hypertension with PA pressure 37 mm.  When I initially saw her, I recommended that she discontinue her Diovan and continue with her Tekturna HCT 300/25.  Due to concerns for sleep apnea, I recommended that she undergo a diagnostic sleep study.  She underwent a diagnostic polysomnogram, which confirmed mild sleep apnea overall with an AHI of 12.0; however, during sleep for sleep apnea was very severe, with an AHI of 56.  She dropped her oxygen saturation to 86% and had evidence for very loud snoring.  25 periodic lid movements were observed with an index of 7.0 with 8.9 per hour to arousal.  She was advised undergo a CPAP titration, but she never had this done.  She tells me that she saw Dr. Melina Copa last  week who checkedlaboratory.  She presents to the office for evaluation.   Past Medical History  Diagnosis Date  . Hypertension   . Hyperlipidemia   . Cancer [facility])     endometrial  . Coronary artery disease     Past Surgical History  Procedure Laterality Date  . Renal doppler  07/11/2011    Right renal artery-1-59% diameter reduction, left-normal, bilateral kidneys-normal in shape, symmetrical in shape, and no abnormal echogenicity or hydronephrosis.  . Cardiovascular stress test  05/23/2009    No scintigraphic evidence of inducible myocardial ischemia. No significant wall motion abnormalities noted. EKG negative for ischemia. Rare PVCs.   . Transthoracic echocardiogram  05/29/2012    EF 55-60%, mild LVH, no regional wall mtion abnormalities.    Allergies  Allergen Reactions  . Penicillins     Current Outpatient Prescriptions  Medication Sig Dispense Refill  . Multiple Vitamins-Minerals (CENTRUM SILVER ULTRA WOMENS PO) Take 1 tablet by mouth daily.    . simvastatin (ZOCOR) 20 MG tablet Take 1 tablet by mouth every other day.     . TEKTURNA HCT 300-25 MG TABS Take 1 tablet by mouth daily.    . timolol (BETIMOL) 0.5 % ophthalmic solution Place 1 drop into both eyes daily.    . Vitamin D, Ergocalciferol, (DRISDOL) 50000 UNITS CAPS capsule Take 50,000 Units by mouth every 7 (seven) days.     No current facility-administered medications for this visit.   Socially she is married and  has 2 children, 6 grandchildren and 7 step grandchildren. There is no history of tobacco use. She does not do routine exercise but she does do yard work and takes of her flower garden.  Family History  Problem Relation Age of Onset  . Cancer - Other Mother     metastatic breast  . Diverticulitis Mother   . Heart disease Mother     valve replacement  . Heart attack Father   . Heart disease Father   . Hyperlipidemia Father   . Hypertension Father   . Heart failure Sister     valve replacement  .  Cancer - Other Brother     kidney  . Heart disease Paternal Uncle   . Heart failure Sister     heart valve replacement  . Cancer - Prostate Brother    ROS General: Negative; No fevers, chills, or night sweats;  HEENT: Negative; No changes in vision or hearing, sinus congestion, difficulty swallowing Pulmonary: Negative; No cough, wheezing, shortness of breath, hemoptysis Cardiovascular: Negative; No chest pain, presyncope, syncope, palpitations GI: Negative; No nausea, vomiting, diarrhea, or abdominal pain GU: Negative; No dysuria, hematuria, or difficulty voiding Musculoskeletal: Negative; no myalgias, joint pain, or weakness Hematologic/Oncology: Negative; no easy bruising, bleeding Endocrine: Negative; no heat/cold intolerance; no diabetes Neuro: Negative; no changes in balance, headaches Skin: Negative; No rashes or skin lesions Psychiatric: Negative; No behavioral problems, depression Sleep: Positive for obstructive sleep apnea, she's not get her CPAP titration trial.; No snoring, daytime sleepiness, hypersomnolence, bruxism, restless legs, hypnogognic hallucinations, no cataplexy Other comprehensive 14 point system review is negative.   PE BP 128/76 mmHg  Pulse 70  Ht 5\' 3"  (1.6 m)  Wt 225 lb 11.2 oz (102.377 kg)  BMI 39.99 kg/m2 General: Alert, oriented, no distress.  Skin: normal turgor, no rashes HEENT: Normocephalic, atraumatic. Pupils round and reactive; sclera anicteric; extraocular muscles intact; Fundi without hemorrhages or exudates. No xanthelasma as per Nose without nasal septal hypertrophy Mouth/Parynx benign; Mallinpatti scale 2 Neck: No JVD, no carotid bruits; normal carotid upstroke Lungs: clear to ausculatation and percussion; no wheezing or rales Chest wall: without tenderness to palpitation Heart: RRR, s1 s2 normal, 1-2/6 sem in the aortic area; No S3 or S4 gallop. No rubs thrills or heaves. Abdomen: soft, nontender; no hepatosplenomehaly, BS+; abdominal  aorta nontender and not dilated by palpation. Back: no CVA tenderness Pulses 2+ Extremities: Very mild ankle edema bilaterally, no clubbinbg cyanosis, Homan's sign negative  Neurologic: grossly nonfocal; Cranial nerves grossly wnl Psychologic: Normal mood and affect  ECG (independently read by me): Normal sinus rhythm at 70 bpm.  PR interval 166 ms, QTc interval 416 ms.  September 2015 ECG (independently read by me): Normal sinus rhythm at 70 beats with mild sinus arrhythmia.  No significant ST changes.  Normal intervals.  Prior ECG : Normal sinus rhythm at 81 beats per minute. Normal intervals.  LAB:  BMET No results found for: NA   Hepatic Function Panel  No results found for: PROT   CBC No results found for: WBC   BNP No results found for: PROBNP  Lipid Panel  No results found for: CHOL   RADIOLOGY: No results found.   ASSESSMENT AND PLAN: Ms. Ruwayda Curet is a 69 year-old female with a history of hypertension, hyperlipidemia, as well as mild obesity.  She is now on Tekturna 300/25 mg for hypertension pressure today is controlled at 128/76.  Her weight has been stable over the past year.  She is  on simvastatin 20 mg for hyperlipidemia.  At this point, I have recommended that she completely discontinue the valsartan.  Her previous diagnostic polysomnogram confirms mild sleep apnea overall, but severe sleep apnea with REM sleep with an AHI of 56.0 with evidence for loud snoring, and oxygen desaturation.  I had referred her for CPAP titration.  Remotely, but she never had this done.  Unfortunately, it is now 6 months beyond her initial study.  I would like to refer her for CPAP titration trial, but per Medicare rules .  She will not be able to have this done alone without reassessment of her OSA.    I will schedule her for a split-night protocol.    We discussed the importance of initiating therapy. She is essentially  morbidly obese by BMI criteria.  We discussed the  importance of weight loss and increased exercise. I will see her in follow-up and further recommendations will be made at that time.    Troy Sine, MD, [facility] 11/24/2014 7:26 PM

## 2014-12-22 ENCOUNTER — Encounter: Payer: Self-pay | Admitting: Cardiovascular Disease

## 2015-02-09 ENCOUNTER — Ambulatory Visit (HOSPITAL_BASED_OUTPATIENT_CLINIC_OR_DEPARTMENT_OTHER): Payer: Medicare Other

## 2015-03-14 ENCOUNTER — Ambulatory Visit (HOSPITAL_BASED_OUTPATIENT_CLINIC_OR_DEPARTMENT_OTHER): Payer: Medicare Other | Attending: Cardiovascular Disease | Admitting: Radiology

## 2015-03-14 VITALS — Ht 64.0 in | Wt 223.0 lb

## 2015-03-14 DIAGNOSIS — Z79899 Other long term (current) drug therapy: Secondary | ICD-10-CM | POA: Diagnosis not present

## 2015-03-14 DIAGNOSIS — G4733 Obstructive sleep apnea (adult) (pediatric): Secondary | ICD-10-CM | POA: Insufficient documentation

## 2015-03-14 DIAGNOSIS — R0683 Snoring: Secondary | ICD-10-CM | POA: Diagnosis not present

## 2015-03-14 DIAGNOSIS — G4761 Periodic limb movement disorder: Secondary | ICD-10-CM | POA: Diagnosis not present

## 2015-03-24 ENCOUNTER — Telehealth: Payer: Self-pay | Admitting: *Deleted

## 2015-03-24 NOTE — Telephone Encounter (Signed)
-----   Message from Troy Sine, MD sent at 03/24/2015  4:55 PM EST ----- Mariann Laster please schedule for CPAP titration

## 2015-03-24 NOTE — Progress Notes (Signed)
Left message to return a call to discuss sleep study. 

## 2015-03-24 NOTE — Telephone Encounter (Signed)
Left message to call back to discuss sleep study results.  

## 2015-03-24 NOTE — Sleep Study (Signed)
Patient Name: Megan Walls, Megan Walls Date: 03/14/2015 Gender: Female D.O.B: [birth year] Age (years): 65 Referring Provider: Shelva Majestic MD, ABSM Height (inches): 64 Interpreting Physician: Shelva Majestic MD, ABSM Weight (lbs): 223 RPSGT: Zadie Rhine BMI: 38 MRN: [id number] Neck Size: 15.50  CLINICAL INFORMATION Sleep Study Type: NPSG   Indication for sleep study: OSA:  The patient had a previous sleep study which revealed and AHI 12.0/h overall, but 56/hr during REM sleep.  She had never returned for a CPAP trial, and is now referred for a split night study.    Epworth Sleepiness Score: 6   SLEEP STUDY TECHNIQUE As per the AASM Manual for the Scoring of Sleep and Associated Events v2.3 (April 2016) with a hypopnea requiring 4% desaturations. The channels recorded and monitored were frontal, central and occipital EEG, electrooculogram (EOG), submentalis EMG (chin), nasal and oral airflow, thoracic and abdominal wall motion, anterior tibialis EMG, snore microphone, electrocardiogram, and pulse oximetry.  MEDICATIONS  Multiple Vitamins-Minerals (CENTRUM SILVER ULTRA WOMENS PO) 1 tablet, Daily     simvastatin (ZOCOR) 20 MG tablet 1 tablet, Every other day     Note: Received from: External Pharmacy Received Sig: (Written 04/28/2013 1537)   TEKTURNA HCT 300-25 MG TABS 1 tablet, Daily     Note: Received from: External Pharmacy Received Sig: (Written 04/28/2013 1537)   timolol (BETIMOL) 0.5 % ophthalmic solution 1 drop, Daily     Vitamin D, Ergocalciferol, (DRISDOL) 50000 UNITS CAPS capsule 50,000 Units, Every 7 days   Medications self-administered by patient during sleep study : No sleep medicine administered.  SLEEP ARCHITECTURE The study was initiated at 11:01:00 PM and ended at 4:58:31 AM. Sleep onset time was 8.2 minutes and the sleep efficiency was 34.1%. The total sleep time was 122.0 minutes. Wake after sleep onset was 227.3 minutes. Stage REM latency was N/A minutes. The  patient spent 27.05% of the night in stage N1 sleep, 72.95% in stage N2 sleep, 0.00% in stage N3 and 0.00% in REM. Alpha intrusion was absent. Supine sleep was 0.00%.  RESPIRATORY PARAMETERS The overall apnea/hypopnea index (AHI) was 5.4 per hour. There were 0 total apneas, including 0 obstructive, 0 central and 0 mixed apneas. There were 11 hypopneas and 61 RERAs. The AHI during Stage REM sleep was N/A per hour. AHI while supine was N/A per hour. The mean oxygen saturation was 92.55%. The minimum SpO2 during sleep was 88.00%. Soft snoring was noted during this study.  CARDIAC DATA The 2 lead EKG demonstrated sinus rhythm. The mean heart rate was 80.24 beats per minute. Other EKG findings include: None.  LEG MOVEMENT DATA The total PLMS were 115 with a resulting PLMS index of 56.56. Associated arousal with leg movement index was 6.9 .  IMPRESSIONS - Mild obstructive sleep apnea occurred during this study (AHI = 5.4/h); however, there was very poor sleep efficiency with absence of both slow wave and REM sleep as well as supine sleep.  The patient had previously been found to have severe OSA during REM sleep on prior study. - No significant central sleep apnea occurred during this study (CAI = 0.0/h). - Markedly reduced sleep efficiency at only 34% with difficulty with sleep initiation and maintenance. - Mild oxygen desaturation was noted during this study (Min O2 = 88.00%). - The patient snored with Soft snoring volume. - No cardiac abnormalities were noted during this study. - Severe periodic limb movements of sleep occurred during the study. Associated arousals were significant.  DIAGNOSIS - Obstructive Sleep Apnea (  327.23 [G47.33 ICD-10]) - Peridic Limb Movement disorder of sleep  RECOMMENDATIONS - Although the present study reveal only mild sleep apnea/hypopnea syndrome, there was very poor sleep efficiency and absence of REM and supine sleep which may be contributing to an  underestimation of the severity of the patient's sleep apnea. With her significant symptoms and since she was previously found to have severe sleep apnea during REM sleep, recommend a CPAP titration trial. - Avoid alcohol, sedatives and other CNS depressants that may worsen sleep apnea and disrupt normal sleep architecture. - Sleep hygiene should be reviewed to assess factors that may improve sleep quality. - Weight management (BMI 38) and regular exercise should be initiated or continued if appropriate. - If patient is symptomatic with restless legs with CPAP therapy consider a trial of medical therapy with a PLMS index of 56.56.  Troy Sine, MD, [place], American Board of Sleep Medicine  ELECTRONICALLY SIGNED ON:  03/24/2015, 4:38 PM [place] PH: [phone]   FX: [phone] [place]

## 2015-03-28 ENCOUNTER — Other Ambulatory Visit: Payer: Self-pay

## 2015-03-28 ENCOUNTER — Ambulatory Visit (HOSPITAL_COMMUNITY): Payer: Medicare Other | Attending: Cardiovascular Disease

## 2015-03-28 DIAGNOSIS — E785 Hyperlipidemia, unspecified: Secondary | ICD-10-CM | POA: Diagnosis not present

## 2015-03-28 DIAGNOSIS — G4733 Obstructive sleep apnea (adult) (pediatric): Secondary | ICD-10-CM | POA: Diagnosis not present

## 2015-03-28 DIAGNOSIS — I10 Essential (primary) hypertension: Secondary | ICD-10-CM

## 2015-03-28 DIAGNOSIS — E669 Obesity, unspecified: Secondary | ICD-10-CM | POA: Insufficient documentation

## 2015-03-28 DIAGNOSIS — R011 Cardiac murmur, unspecified: Secondary | ICD-10-CM | POA: Insufficient documentation

## 2015-03-28 DIAGNOSIS — Z6838 Body mass index (BMI) 38.0-38.9, adult: Secondary | ICD-10-CM | POA: Insufficient documentation

## 2015-04-12 ENCOUNTER — Telehealth: Payer: Self-pay | Admitting: *Deleted

## 2015-04-12 NOTE — Telephone Encounter (Signed)
Left message to return a call to discuss sleep study. 

## 2015-04-25 NOTE — Addendum Note (Signed)
Addended by: Andres Ege on: 04/25/2015 04:00 PM   Modules accepted: Orders

## 2015-05-02 NOTE — Telephone Encounter (Signed)
Discussed sleep study with patient.  She stated verbal understanding but would not schedule titration.  She stated this is not something that she will do until she has a follow-up appointment.   I explained that this was his recommendation and she said that she would not schedule until she met with him.  She said that she would call back to schedule a follow-up.

## 2015-05-09 ENCOUNTER — Other Ambulatory Visit: Payer: Self-pay

## 2015-05-09 DIAGNOSIS — Z1231 Encounter for screening mammogram for malignant neoplasm of breast: Secondary | ICD-10-CM

## 2015-06-20 ENCOUNTER — Ambulatory Visit
Admission: RE | Admit: 2015-06-20 | Discharge: 2015-06-20 | Disposition: A | Discharge status: Home / Self Care | Payer: Medicare Other | Source: Ambulatory Visit

## 2015-06-20 DIAGNOSIS — Z1231 Encounter for screening mammogram for malignant neoplasm of breast: Secondary | ICD-10-CM

## 2015-11-16 ENCOUNTER — Ambulatory Visit: Payer: Medicare Other | Admitting: Cardiovascular Disease

## 2016-01-24 ENCOUNTER — Ambulatory Visit (INDEPENDENT_AMBULATORY_CARE_PROVIDER_SITE_OTHER): Payer: Medicare Other | Admitting: Cardiovascular Disease

## 2016-01-24 ENCOUNTER — Encounter: Payer: Self-pay | Admitting: Cardiovascular Disease

## 2016-01-24 VITALS — BP 139/75 | HR 70 | Ht 63.2 in | Wt 210.0 lb

## 2016-01-24 DIAGNOSIS — G4733 Obstructive sleep apnea (adult) (pediatric): Secondary | ICD-10-CM | POA: Diagnosis not present

## 2016-01-24 DIAGNOSIS — R011 Cardiac murmur, unspecified: Secondary | ICD-10-CM | POA: Diagnosis not present

## 2016-01-24 DIAGNOSIS — E669 Obesity, unspecified: Secondary | ICD-10-CM | POA: Diagnosis not present

## 2016-01-24 DIAGNOSIS — I10 Essential (primary) hypertension: Secondary | ICD-10-CM

## 2016-01-24 DIAGNOSIS — E7800 Pure hypercholesterolemia, unspecified: Secondary | ICD-10-CM | POA: Diagnosis not present

## 2016-01-24 NOTE — Patient Instructions (Signed)
Your physician wants you to follow-up in: 1 year or sooner if needed. You will receive a reminder letter in the mail two months in advance. If you don't receive a letter, please call our office to schedule the follow-up appointment.   If you need a refill on your cardiac medications before your next appointment, please call your pharmacy.   

## 2016-01-26 NOTE — Progress Notes (Signed)
Patient ID: Megan Walls, female   DOB: Apr 07, 1945, 70 y.o.   MRN: [id number]     HPI:  Ms. Megan Walls is a 70year-old female who is a former patient of Dr. Terance Ice and is followed by Dr. Octavio Graves for primary care.  She presents for a 71 month cardiology evaluation.  Ms. Megan Walls has a history of hypertension, hyperlipidemia, as well as obesity.  When I initially saw her chart review indicated that she has been on both direct renin in addition as well as very low dose ARB therapy for her blood pressure. She also has been on simvastatin 20 mg for hyperlipidemia. She last saw Dr. Rollene Fare in July 2014.  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 There is no established coronary artery disease. She also has a history of thyroid nodules which were felt to be benign. She states that there is also a history of endometrial cancer and 37 lymph nodes were removed. There also is a history of glaucoma.   An echo Doppler study in April 2014 showed mild concentric left ventricular hypertrophy with normal systolic function. There was grade 1 diastolic dysfunction. There is evidence for mitral annular calcification with mild mitral regurgitation. There was mild pulmonary hypertension with PA pressure 37 mm.  When I initially saw her, I recommended that she discontinue her Diovan and continue with her Tekturna HCT 300/25.  Due to concerns for sleep apnea, I recommended that she undergo a diagnostic sleep study.  She underwent a diagnostic polysomnogram, which confirmed mild sleep apnea overall with an AHI of 12.0; however, during sleep for sleep apnea was very severe, with an AHI of 56.  She dropped her oxygen saturation to 86% and had evidence for very loud snoring.  25 periodic lid movements were observed with an index of 7.0 with 8.9 per hour to arousal.  She was advised undergo a CPAP titration, but she never had this done.  In January 2017 she was scheduled to undergo a  split-night trial for reevaluation of her sleep apnea.  She did not meet split-night criteria.  Overall AHI was consistent with mild sleep apnea at 5.4 per hour.  However, she had very poor sleep efficiency at 34% and was never able to achieve in rem sleep.  Her oxygen desaturated to 88%.  He was recommended with her symptoms to pursue a CPAP trial, particularly since she was found to have severe sleep apnea previously during rems sleep and no rems sleep was present on her most recent study.  However, she again never had a CPAP titration trial and would prefer not to pursue this.  Since I last saw her, she has undergone colonoscopy in endoscopy in October and was told of having diverticulosis.  She required a packed red blood cell transfusion earlier in the fall.  An echo Doppler study in February 2017 showed an EF of 55-60% with grade 1 diastolic dysfunction.  She had normal pulmonary pressures.  She admits to a 13 pound weight loss over the past 6 months.  She presents for reevaluation.  Past Medical History:  Diagnosis Date  . Cancer [facility])    endometrial  . Coronary artery disease   . Hyperlipidemia   . Hypertension     Past Surgical History:  Procedure Laterality Date  . CARDIOVASCULAR STRESS TEST  05/23/2009   No scintigraphic evidence of inducible myocardial ischemia. No significant wall motion abnormalities noted. EKG negative for ischemia. Rare PVCs.   Marland Kitchen RENAL DOPPLER  07/11/2011   Right renal artery-1-59% diameter reduction, left-normal, bilateral kidneys-normal in shape, symmetrical in shape, and no abnormal echogenicity or hydronephrosis.  . TRANSTHORACIC ECHOCARDIOGRAM  05/29/2012   EF 55-60%, mild LVH, no regional wall mtion abnormalities.    Allergies  Allergen Reactions  . Penicillins     Current Outpatient Prescriptions  Medication Sig Dispense Refill  . Multiple Vitamins-Minerals (CENTRUM SILVER ULTRA WOMENS PO) Take 1 tablet by mouth daily.    . simvastatin (ZOCOR) 20 MG  tablet Take 1 tablet by mouth every other day.     . TEKTURNA HCT 300-25 MG TABS Take 1 tablet by mouth daily.    . timolol (BETIMOL) 0.5 % ophthalmic solution Place 1 drop into both eyes daily.    . vitamin C (ASCORBIC ACID) 500 MG tablet Take 500 mg by mouth daily.    . Vitamin D, Ergocalciferol, (DRISDOL) 50000 UNITS CAPS capsule Take 50,000 Units by mouth every 7 (seven) days.     No current facility-administered medications for this visit.    Socially she is married and has 2 children, 6 grandchildren and 7 step grandchildren. There is no history of tobacco use. She does not do routine exercise but she does do yard work and takes of her flower garden.  Family History  Problem Relation Age of Onset  . Cancer - Other Mother     metastatic breast  . Diverticulitis Mother   . Heart disease Mother     valve replacement  . Heart attack Father   . Heart disease Father   . Hyperlipidemia Father   . Hypertension Father   . Heart failure Sister     valve replacement  . Cancer - Other Brother     kidney  . Heart disease Paternal Uncle   . Heart failure Sister     heart valve replacement  . Cancer - Prostate Brother    ROS General: Negative; No fevers, chills, or night sweats;  HEENT: Negative; No changes in vision or hearing, sinus congestion, difficulty swallowing Pulmonary: Negative; No cough, wheezing, shortness of breath, hemoptysis Cardiovascular: Negative; No chest pain, presyncope, syncope, palpitations GI: Negative; No nausea, vomiting, diarrhea, or abdominal pain GU: Negative; No dysuria, hematuria, or difficulty voiding Musculoskeletal: Negative; no myalgias, joint pain, or weakness Hematologic/Oncology: Negative; no easy bruising, bleeding Endocrine: Negative; no heat/cold intolerance; no diabetes Neuro: Negative; no changes in balance, headaches Skin: Negative; No rashes or skin lesions Psychiatric: Negative; No behavioral problems, depression Sleep: Positive for  obstructive sleep apnea, she's not get her CPAP titration trial.; No snoring, daytime sleepiness, hypersomnolence, bruxism, restless legs, hypnogognic hallucinations, no cataplexy Other comprehensive 14 point system review is negative.   PE BP 139/75   Pulse 70   Ht 5' 3.2" (1.605 m)   Wt 210 lb (95.3 kg)   BMI 36.96 kg/m    Repeat blood pressure by me 124/72  Wt Readings from Last 3 Encounters:  01/24/16 210 lb (95.3 kg)  03/14/15 223 lb (101.2 kg)  11/23/14 225 lb 11.2 oz (102.4 kg)   General: Alert, oriented, no distress.  Skin: normal turgor, no rashes HEENT: Normocephalic, atraumatic. Pupils round and reactive; sclera anicteric; extraocular muscles intact; Fundi without hemorrhages or exudates. No xanthelasma as per Nose without nasal septal hypertrophy Mouth/Parynx benign; Mallinpatti scale 2 Neck: No JVD, no carotid bruits; normal carotid upstroke Lungs: clear to ausculatation and percussion; no wheezing or rales Chest wall: without tenderness to palpitation Heart: RRR, s1 s2 normal, 1-2/6 sem in the  aortic area; No S3 or S4 gallop. No rubs thrills or heaves. Abdomen: soft, nontender; no hepatosplenomehaly, BS+; abdominal aorta nontender and not dilated by palpation. Back: no CVA tenderness Pulses 2+ Extremities: Very mild ankle edema bilaterally, no clubbinbg cyanosis, Homan's sign negative  Neurologic: grossly nonfocal; Cranial nerves grossly wnl Psychologic: Normal mood and affect  ECG (independently read by me): Normal sinus rhythm at 70 bpm.  No ectopy.  Normal intervals.  October 2016 ECG (independently read by me): Normal sinus rhythm at 70 bpm.  PR interval 166 ms, QTc interval 416 ms.  September 2015 ECG (independently read by me): Normal sinus rhythm at 70 beats with mild sinus arrhythmia.  No significant ST changes.  Normal intervals.  Prior ECG : Normal sinus rhythm at 81 beats per minute. Normal intervals.  LAB:  BMET No results found for:  NA   Hepatic Function Panel  No results found for: PROT   CBC No results found for: WBC   BNP No results found for: PROBNP  Lipid Panel  No results found for: CHOL   RADIOLOGY: No results found.   ASSESSMENT AND PLAN: Ms. Niasia Lennox is a 70 year-old female with a history of hypertension, hyperlipidemia, moderate obesity and untreated sleep apnea..  She is on Tekturna 300/25 mg for hypertension pressure today is controlled at 128/76.  She has moderate obesity with BMI today of 36.96.  However, she recently has lost 13 pounds over the past 6 months..  She is on simvastatin 20 mg for hyperlipidemia.  Her previous diagnostic polysomnogram -year-old mild sleep apnea overall, but severe sleep apnea with REM sleep with an AHI of 56.0 with evidence for loud snoring, and oxygen desaturation.  I had referred her for CPAP titration.  Remotely, but she never had this done.  I reviewed her most recent split-night attempt polysomnogram but she did not meet split-night criteria due to very poor sleep efficiency.  On that study due to her very poor sleep efficiency REM sleep was not achieved and the overall AHI undoubtedly underestimates the severity of her obstructive sleep apnea.  Again, however, she never pursued the recommended CPAP titration.  She believes that her sleep pattern has improved.  I discussed with her importance of more significant weight reduction.  I reviewed her most recent echo Doppler study which confirms normal systolic function with grade 1 diastolic dysfunction.  He will monitor her blood pressure.  She'll continue lipid-lowering therapy.  I will try to obtain results of recent blood work at she may have had with her primary physician.  As long as she remains stable, I will see her in one year for reevaluation.  Time spent: 25 minutes Troy Sine, MD, [facility] 01/26/2016 4:14 PM

## 2016-05-10 ENCOUNTER — Other Ambulatory Visit: Payer: Self-pay | Admitting: *Deleted

## 2016-05-10 DIAGNOSIS — Z1231 Encounter for screening mammogram for malignant neoplasm of breast: Secondary | ICD-10-CM

## 2016-06-20 ENCOUNTER — Ambulatory Visit
Admission: RE | Admit: 2016-06-20 | Discharge: 2016-06-20 | Disposition: A | Discharge status: Home / Self Care | Payer: Medicare Other | Source: Ambulatory Visit | Attending: *Deleted | Admitting: *Deleted

## 2016-06-20 ENCOUNTER — Other Ambulatory Visit: Payer: Self-pay | Admitting: *Deleted

## 2016-06-20 DIAGNOSIS — Z1231 Encounter for screening mammogram for malignant neoplasm of breast: Secondary | ICD-10-CM

## 2017-05-13 ENCOUNTER — Other Ambulatory Visit: Payer: Self-pay | Admitting: *Deleted

## 2017-05-13 DIAGNOSIS — Z1231 Encounter for screening mammogram for malignant neoplasm of breast: Secondary | ICD-10-CM

## 2017-06-24 ENCOUNTER — Ambulatory Visit: Payer: Medicare Other

## 2017-06-24 ENCOUNTER — Ambulatory Visit
Admission: RE | Admit: 2017-06-24 | Discharge: 2017-06-24 | Disposition: A | Discharge status: Home / Self Care | Payer: Medicare Other | Source: Ambulatory Visit | Attending: *Deleted | Admitting: *Deleted

## 2017-06-24 DIAGNOSIS — Z1231 Encounter for screening mammogram for malignant neoplasm of breast: Secondary | ICD-10-CM

## 2017-06-25 ENCOUNTER — Other Ambulatory Visit: Payer: Self-pay | Admitting: *Deleted

## 2017-06-25 DIAGNOSIS — R928 Other abnormal and inconclusive findings on diagnostic imaging of breast: Secondary | ICD-10-CM

## 2017-06-27 ENCOUNTER — Ambulatory Visit
Admission: RE | Admit: 2017-06-27 | Discharge: 2017-06-27 | Disposition: A | Discharge status: Home / Self Care | Payer: Medicare Other | Source: Ambulatory Visit | Attending: *Deleted | Admitting: *Deleted

## 2017-06-27 ENCOUNTER — Ambulatory Visit: Payer: Medicare Other

## 2017-06-27 DIAGNOSIS — R928 Other abnormal and inconclusive findings on diagnostic imaging of breast: Secondary | ICD-10-CM

## 2017-06-28 ENCOUNTER — Other Ambulatory Visit: Payer: Medicare Other
